# Patient Record
Sex: Male | Born: 1940 | Hispanic: Yes | Marital: Married | Smoking: Never smoker
Health system: Southern US, Community
[De-identification: ages and names within clinical notes are randomized; demographics above are authoritative.]

## PROBLEM LIST (undated history)

## (undated) DIAGNOSIS — I10 Essential (primary) hypertension: Secondary | ICD-10-CM

## (undated) HISTORY — DX: Essential (primary) hypertension: I10

---

## 2016-04-07 ENCOUNTER — Encounter (HOSPITAL_BASED_OUTPATIENT_CLINIC_OR_DEPARTMENT_OTHER): Payer: Self-pay

## 2016-04-07 ENCOUNTER — Emergency Department (HOSPITAL_BASED_OUTPATIENT_CLINIC_OR_DEPARTMENT_OTHER)
Admission: EM | Admit: 2016-04-07 | Discharge: 2016-04-07 | Disposition: A | Discharge status: Home / Self Care | Payer: Medicare Other | Attending: Emergency Medicine | Admitting: Emergency Medicine

## 2016-04-07 DIAGNOSIS — R040 Epistaxis: Secondary | ICD-10-CM | POA: Diagnosis present

## 2016-04-07 DIAGNOSIS — I10 Essential (primary) hypertension: Secondary | ICD-10-CM | POA: Insufficient documentation

## 2016-04-07 LAB — BASIC METABOLIC PANEL
ANION GAP: 7 (ref 5–15)
BUN: 19 mg/dL (ref 6–20)
CALCIUM: 9.3 mg/dL (ref 8.9–10.3)
CO2: 27 mmol/L (ref 22–32)
Chloride: 105 mmol/L (ref 101–111)
Creatinine, Ser: 0.89 mg/dL (ref 0.61–1.24)
GLUCOSE: 112 mg/dL — AB (ref 65–99)
POTASSIUM: 3.8 mmol/L (ref 3.5–5.1)
Sodium: 139 mmol/L (ref 135–145)

## 2016-04-07 MED ORDER — ENALAPRIL-HYDROCHLOROTHIAZIDE 5-12.5 MG PO TABS: 1.0000 | ORAL_TABLET | Freq: Every day | ORAL | 0 refills | Status: DC

## 2016-04-07 NOTE — Discharge Instructions (Signed)
[facility] can [facility]. Keep your scheduled plan with your primary care physician 04/14/2016

## 2016-04-07 NOTE — ED Notes (Signed)
ED Provider at bedside. 

## 2016-04-07 NOTE — ED Provider Notes (Signed)
[facility] Provider Note   CSN: [id number] Arrival date & time: 04/07/16  1941  By signing my name below, I, Rosario AdieWilliam Andrew Hiatt, attest that this documentation has been prepared under the direction and in the presence of Doug SouSam Preesha Benjamin, MD. Electronically Signed: Rosario AdieWilliam Andrew Hiatt, ED Scribe. 04/07/16. 10:35 PM.  History   Chief Complaint Chief Complaint  Patient presents with  . Hypertension   The history is provided by the patient and the spouse. No language interpreter was used.    HPI Comments: William Rice is an otherwise healthy 76 y.o. male who presents to the Emergency Department complaining of elevated blood pressure. Pt reports that he sustained an episode of epistaxis of the left nare this morning, which lasted for one minute and resolved on its own at that time. No episodes since. Following this episode, he was [facility] where they noted that his BP was 230/110, and he was subsequently referred into the ED for further management. He also took his BP at home today and his wife states that his systolic number was in the 200s. No h/o prior HTN. He is not currently followed by a PCP; however, his wife has scheduled an appointment to establish care with a PCP on 04/17/16. Denies headache denies chest pain denies lightheadedness he is otherwise asymptomatic no treatment prior to coming here Pt is a non-smoker and he does not drink alcohol excessively. He denies chest pain, shortness of breath, dizziness, headache, or any other associated symptoms/complaints.   History reviewed. No pertinent past medical history.  There are no active problems to display for this patient.  History reviewed. No pertinent surgical history.  Home Medications    Prior to Admission medications   Not on File   Family History No family history on file.  Social History Social History  Substance Use Topics  . Smoking status: Never Smoker  . Smokeless tobacco: Never Used  . Alcohol  use Yes     Comment: wine daily   Allergies   Patient has no known allergies.  Review of Systems Review of Systems  Constitutional: Negative.   HENT: Positive for nosebleeds.   Respiratory: Negative.  Negative for shortness of breath.   Cardiovascular: Negative.  Negative for chest pain.  Gastrointestinal: Negative.   Musculoskeletal: Negative.   Skin: Negative.   Neurological: Negative.  Negative for dizziness and headaches.  Psychiatric/Behavioral: Negative.   All other systems reviewed and are negative.  Physical Exam Updated Vital Signs BP 181/98   Pulse 78   Temp 98.3 F (36.8 C) (Oral)   Resp 18   Ht 5\' 7"  (1.702 m)   Wt 180 lb 12.8 oz (82 kg)   SpO2 95%   BMI 28.32 kg/m   Physical Exam  Constitutional: He appears well-developed and well-nourished.  HENT:  Head: Normocephalic and atraumatic.  Eyes: Conjunctivae are normal. Pupils are equal, round, and reactive to light.  Neck: Neck supple. No tracheal deviation present. No thyromegaly present.  Cardiovascular: Normal rate and regular rhythm.   No murmur heard. Pulmonary/Chest: Effort normal and breath sounds normal.  Abdominal: Soft. Bowel sounds are normal. He exhibits no distension. There is no tenderness.  Musculoskeletal: Normal range of motion. He exhibits no edema or tenderness.  Neurological: He is alert. Coordination normal.  Skin: Skin is warm and dry. No rash noted.  Psychiatric: He has a normal mood and affect.  Nursing note and vitals reviewed. Patient has no bleeding from nose and no dried blood  nares ED Treatments / Results  DIAGNOSTIC STUDIES: Oxygen Saturation is 97% on RA, normal by my interpretation.   COORDINATION OF CARE: 10:35 PM-Discussed next steps with pt. Pt verbalized understanding and is agreeable with the plan.   Labs (all labs ordered are listed, but only abnormal results are displayed) Labs Reviewed - No data to display  EKG  EKG Interpretation  Date/Time:  Monday  April 07 2016 19:58:14 EST Ventricular Rate:  80 PR Interval:  214 QRS Duration: 156 QT Interval:  412 QTC Calculation: 475 R Axis:   6 Text Interpretation:  Sinus rhythm with 1st degree A-V block Left bundle branch block Abnormal ECG No old tracing to compare Confirmed by Ethelda Chick  MD, Timera Windt [phone]) on 04/07/2016 8:00:40 PM      Results for orders placed or performed during the hospital encounter of 04/07/16  Basic metabolic panel  Result Value Ref Range   Sodium 139 135 - 145 mmol/L   Potassium 3.8 3.5 - 5.1 mmol/L   Chloride 105 101 - 111 mmol/L   CO2 27 22 - 32 mmol/L   Glucose, Bld 112 (H) 65 - 99 mg/dL   BUN 19 6 - 20 mg/dL   Creatinine, Ser 1.91 0.61 - 1.24 mg/dL   Calcium 9.3 8.9 - 47.8 mg/dL   GFR calc non Af Amer >60 >60 mL/min   GFR calc Af Amer >60 >60 mL/min   Anion gap 7 5 - 15   No results found.Radiology No results found.  Procedures Procedures   Medications Ordered in ED Medications - No data to display  Initial Impression / Assessment and Plan / ED Course  I have reviewed the triage vital signs and the nursing notes.  Pertinent labs & imaging results that were available during my care of the patient were reviewed by me and considered in my medical decision making (see chart for details).  Clinical Course    No signs of hypertensive emergency or end organ damage. He has essentially asymptomatic hypertension with an incidental brief and self-limiting nosebleed today. Plan keep scheduled appointment with primary care physician in 10 days. Prescription enalapril-HCTZ Final Clinical Impressions(s) / ED Diagnoses  Diagnosis #1 hypertension #2 epistaxis Final diagnoses:  None   New Prescriptions New Prescriptions   No medications on file   I personally performed the services described in this documentation, which was scribed in my presence. The recorded information has been reviewed and considered.    Doug Sou, MD 04/07/16 [phone]

## 2016-04-07 NOTE — ED Triage Notes (Signed)
C/o elevated BP 2 weeks ago when he checked it in [facility]-no known hx-was sent from [facility] for BP 230/110-pt denies HA, CP-did have nosebleed earlier today-none at present-NAD-steady gait

## 2016-04-17 ENCOUNTER — Ambulatory Visit: Payer: Self-pay | Admitting: Medical

## 2016-04-24 ENCOUNTER — Encounter: Payer: Self-pay | Admitting: Medical

## 2016-04-24 ENCOUNTER — Ambulatory Visit (INDEPENDENT_AMBULATORY_CARE_PROVIDER_SITE_OTHER): Payer: Medicare Other | Admitting: Medical

## 2016-04-24 VITALS — BP 130/87 | HR 79 | Temp 98.0°F | Ht 67.0 in | Wt 177.8 lb

## 2016-04-24 DIAGNOSIS — I10 Essential (primary) hypertension: Secondary | ICD-10-CM

## 2016-04-24 NOTE — Patient Instructions (Signed)
Your bp when I checked was good. I want you to check twice daily and write readings down so we can confirm you are on correct dose. If bp levels above 140/90 when you check will increase your dose.  Try to walk 20-30 minutes a day. Low cholesterol, low sugar and low salt diet.  Follow up in 2 weeks early am. Come in fasting will recheck bp and review your bp readings. Get lipid panel and a1-c.

## 2016-04-24 NOTE — Progress Notes (Signed)
Subjective:    Patient ID: William Rice, male    DOB: 13-Jan-1941, 76 y.o.   MRN: [id number]  HPI   I have reviewed pt PMH, PSH, FH, Social History and Surgical History.  Pt states he feels good today. He only states about one month had brief nose bleed. Pt son thought maybe his blood pressure was elevated. Pt has checked his bp various times since then. Pt states one time at his home his systolic was 200 at home. At ED on 04-07-2016  bp was 181/98. Prior to that was high at [facility] and told go to ED.  Pt was given bp med enalarpil/hctz. His bp is better. No obvious side effect of med. Pt thinks after med sometimes faint head pressure that lasts on for seconds and then passes with residual effect. He states this rare and random. No symptoms presently.  No cardiac or neurologic signs or symptoms today.  Pt nose bleeds have not been recurrent. Only one time in [place]. And then 2 days before he went to [facility].    Pt never had flu vaccine(pt declined).  Pt does not get regular maintenance health care. Last seen for knee pain 10 years.      Review of Systems  Constitutional: Negative for chills, fatigue and fever.  HENT: Negative for congestion and drooling.   Respiratory: Negative for cough, choking, chest tightness, shortness of breath and wheezing.   Cardiovascular: Negative for chest pain and palpitations.  Gastrointestinal: Negative for abdominal pain.  Genitourinary: Negative for dysuria and flank pain.  Musculoskeletal: Negative for back pain.  Skin: Negative for rash.  Neurological: Negative for dizziness, seizures, speech difficulty, weakness, light-headedness and headaches.  Hematological: Does not bruise/bleed easily.  Psychiatric/Behavioral: Negative for behavioral problems, confusion, hallucinations, sleep disturbance and suicidal ideas. The patient is not nervous/anxious.     Past Medical History:  Diagnosis Date  . Hypertension      Social History   Social  History  . Marital status: Married    Spouse name: N/A  . Number of children: N/A  . Years of education: N/A   Occupational History  . Not on file.   Social History Main Topics  . Smoking status: Never Smoker  . Smokeless tobacco: Never Used  . Alcohol use Yes     Comment: wine daily  . Drug use: No  . Sexual activity: Not on file   Other Topics Concern  . Not on file   Social History Narrative  . No narrative on file    No past surgical history on file.  Family History  Problem Relation Age of Onset  . Heart disease Father     No Known Allergies  Current Outpatient Prescriptions on File Prior to Visit  Medication Sig Dispense Refill  . Enalapril-Hydrochlorothiazide 5-12.5 MG tablet Take 1 tablet by mouth daily. 30 tablet 0   No current facility-administered medications on file prior to visit.     BP (!) 142/87   Pulse 79   Temp 98 F (36.7 C) (Oral)   Ht 5\' 7"  (1.702 m)   Wt 177 lb 12.8 oz (80.6 kg)   SpO2 97%   BMI 27.85 kg/m       Objective:   Physical Exam   General Mental Status- Alert. General Appearance- Not in acute distress.   Skin General: Color- Normal Color. Moisture- Normal Moisture.  Neck Carotid Arteries- Normal color. Moisture- Normal Moisture. No carotid bruits. No JVD.  Chest and Lung  Exam Auscultation: Breath Sounds:-Normal.  Cardiovascular Auscultation:Rythm- Regular. Murmurs & Other Heart Sounds:Auscultation of the heart reveals- No Murmurs.  Abdomen Inspection:-Inspeection Normal. Palpation/Percussion:Note:No mass. Palpation and Percussion of the abdomen reveal- Non Tender, Non Distended + BS, no rebound or guarding.   Neurologic Cranial Nerve exam:- CN III-XII intact(No nystagmus), symmetric smile. Finger to Nose:- Normal/Intact Strength:- 5/5 equal and symmetric strength both upper and lower extremities.     Assessment & Plan:  Your bp when I checked was good. I want you to check twice daily and write  readings down so we can confirm you are on correct dose. If bp levels above 140/90 when you check will increase your dose.  Try to walk 20-30 minutes a day. Low cholesterol, low sugar and low salt diet.  Follow up in 2 weeks early am. Come in fasting will recheck bp and review your bp readings. Get lipid panel and a1-c.  Kyung Muto, Ramon DredgeEdward, PA-C

## 2016-05-08 ENCOUNTER — Ambulatory Visit (INDEPENDENT_AMBULATORY_CARE_PROVIDER_SITE_OTHER): Payer: Medicare Other | Admitting: Medical

## 2016-05-08 ENCOUNTER — Encounter: Payer: Self-pay | Admitting: Medical

## 2016-05-08 ENCOUNTER — Telehealth: Payer: Self-pay | Admitting: Medical

## 2016-05-08 VITALS — BP 160/85 | HR 66 | Temp 97.5°F | Ht 67.0 in | Wt 178.6 lb

## 2016-05-08 DIAGNOSIS — I10 Essential (primary) hypertension: Secondary | ICD-10-CM | POA: Diagnosis not present

## 2016-05-08 DIAGNOSIS — Z23 Encounter for immunization: Secondary | ICD-10-CM | POA: Diagnosis not present

## 2016-05-08 LAB — LIPID PANEL
CHOL/HDL RATIO: 4
Cholesterol: 223 mg/dL — ABNORMAL HIGH (ref 0–200)
HDL: 56.2 mg/dL (ref >39.00)
LDL Cholesterol: 146 mg/dL — ABNORMAL HIGH (ref 0–99)
NonHDL: 166.42
TRIGLYCERIDES: 103 mg/dL (ref 0.0–149.0)
VLDL: 20.6 mg/dL (ref 0.0–40.0)

## 2016-05-08 MED ORDER — ATORVASTATIN CALCIUM 10 MG PO TABS: 10.0000 mg | ORAL_TABLET | Freq: Every day | ORAL | 3 refills | Status: DC

## 2016-05-08 MED ORDER — LISINOPRIL-HYDROCHLOROTHIAZIDE 10-12.5 MG PO TABS: 1.0000 | ORAL_TABLET | Freq: Every day | ORAL | 3 refills | Status: DC

## 2016-05-08 MED ORDER — ENALAPRIL MALEATE 10 MG PO TABS: 10.0000 mg | ORAL_TABLET | Freq: Every day | ORAL | 3 refills | Status: DC

## 2016-05-08 NOTE — Progress Notes (Signed)
   Subjective:    Patient ID: William Rice, male    DOB: Oct 12, 1940, 76 y.o.   MRN: [id number]  HPI  Pt in for follow up.   Pt has htn. His bp readings are 162/87, 141/81, 132/80,138/84, 133/73, 140/85, 127/77, and 134/81. These are some of his readings. 14 out of 26 are in 135/80 range. 9 are in 140/90 range. 3 are in 160/90 range. I had asked him to come in with readings since bp elevated last time. No cardiac or neurologic symptoms.  Pt is fasting.  He does want flu vaccine today.   Review of Systems  Constitutional: Negative for chills, fatigue and fever.  HENT: Negative for congestion and drooling.   Respiratory: Negative for chest tightness, shortness of breath and wheezing.   Cardiovascular: Negative for chest pain and palpitations.  Musculoskeletal: Negative for back pain and myalgias.  Skin: Negative for rash.  Neurological: Negative for dizziness, tremors, seizures, light-headedness and numbness.  Hematological: Negative for adenopathy. Does not bruise/bleed easily.  Psychiatric/Behavioral: Negative for agitation, behavioral problems, decreased concentration, dysphoric mood and sleep disturbance.    Past Medical History:  Diagnosis Date  . Hypertension      Social History   Social History  . Marital status: Married    Spouse name: N/A  . Number of children: N/A  . Years of education: N/A   Occupational History  . Not on file.   Social History Main Topics  . Smoking status: Never Smoker  . Smokeless tobacco: Never Used  . Alcohol use Yes     Comment: wine daily. one glass  . Drug use: No  . Sexual activity: Not on file   Other Topics Concern  . Not on file   Social History Narrative  . No narrative on file    No past surgical history on file.  Family History  Problem Relation Age of Onset  . Heart disease Father     No Known Allergies  Current Outpatient Prescriptions on File Prior to Visit  Medication Sig Dispense Refill  . [DISCONTINUED]  Enalapril-Hydrochlorothiazide 5-12.5 MG tablet Take 1 tablet by mouth daily. 30 tablet 0   No current facility-administered medications on file prior to visit.     BP (!) 160/85   Pulse 66   Temp 97.5 F (36.4 C) (Oral)   Ht 5\' 7"  (1.702 m)   Wt 178 lb 9.6 oz (81 kg)   SpO2 98%   BMI 27.97 kg/m        Objective:   Physical Exam  General Mental Status- Alert. General Appearance- Not in acute distress.   Skin General: Color- Normal Color. Moisture- Normal Moisture.  Neck Carotid Arteries- Normal color. Moisture- Normal Moisture. No carotid bruits. No JVD.  Chest and Lung Exam Auscultation: Breath Sounds:-Normal.  Cardiovascular Auscultation:Rythm- Regular. Murmurs & Other Heart Sounds:Auscultation of the heart reveals- No Murmurs.  Abdomen Inspection:-Inspeection Normal. Palpation/Percussion:Note:No mass. Palpation and Percussion of the abdomen reveal- Non Tender, Non Distended + BS, no rebound or guarding.   Neurologic Cranial Nerve exam:- CN III-XII intact(No nystagmus), symmetric smile. Strength:- 5/5 equal and symmetric strength both upper and lower extremities.      Assessment & Plan:  For your htn decided to write you lisinopril 10/12.5 1 tab po q day. Continue to check. Want to see 130/80-0-140/90 majority of time.(stop enalapril/hctz) Increasing dose incrimentally.  Get liipid panel today.  Flu vaccine given.  Follow up in 3 months or as needed

## 2016-05-08 NOTE — Patient Instructions (Addendum)
For your htn decided to write you lisinopril 10/12.5 1 tab po q day. Continue to check. Want to see 130/80-0-140/90 majority of time.(stop enalapril/hctz) Increasing dose incrimentally.  Get liipid panel today.  Flu vaccine given.  Follow up in 3 months or as needed  Explained to pt come in sooner if bp is not well controlled.

## 2016-05-08 NOTE — Progress Notes (Signed)
Pre visit review using our clinic tool,if applicable. No additional management support is needed unless otherwise documented below in the visit note.  

## 2016-05-08 NOTE — Telephone Encounter (Signed)
rx atorvastatin sent to pharmacy. 

## 2016-05-09 NOTE — Telephone Encounter (Signed)
Called pt and LVM for pt to call back.

## 2016-05-13 NOTE — Progress Notes (Signed)
Pt was informed about his results and will pick up prescription at pharmacy and already schedule his lab appt on Aug 11, 2016.

## 2016-07-25 ENCOUNTER — Ambulatory Visit (INDEPENDENT_AMBULATORY_CARE_PROVIDER_SITE_OTHER): Payer: Medicare Other | Admitting: Medical

## 2016-07-25 ENCOUNTER — Encounter: Payer: Self-pay | Admitting: Medical

## 2016-07-25 VITALS — BP 139/79 | HR 60 | Temp 97.9°F | Resp 16 | Ht 67.0 in | Wt 182.6 lb

## 2016-07-25 DIAGNOSIS — E785 Hyperlipidemia, unspecified: Secondary | ICD-10-CM

## 2016-07-25 DIAGNOSIS — I10 Essential (primary) hypertension: Secondary | ICD-10-CM | POA: Diagnosis not present

## 2016-07-25 DIAGNOSIS — H6123 Impacted cerumen, bilateral: Secondary | ICD-10-CM | POA: Diagnosis not present

## 2016-07-25 LAB — COMPREHENSIVE METABOLIC PANEL
ALBUMIN: 4.5 g/dL (ref 3.5–5.2)
ALT: 17 U/L (ref 0–53)
AST: 16 U/L (ref 0–37)
Alkaline Phosphatase: 55 U/L (ref 39–117)
BILIRUBIN TOTAL: 1 mg/dL (ref 0.2–1.2)
BUN: 13 mg/dL (ref 6–23)
CALCIUM: 9.6 mg/dL (ref 8.4–10.5)
CO2: 31 mEq/L (ref 19–32)
Chloride: 104 mEq/L (ref 96–112)
Creatinine, Ser: 0.79 mg/dL (ref 0.40–1.50)
GFR: 102.09 mL/min (ref >60.00)
GLUCOSE: 88 mg/dL (ref 70–99)
POTASSIUM: 3.7 meq/L (ref 3.5–5.1)
Sodium: 140 mEq/L (ref 135–145)
Total Protein: 7.5 g/dL (ref 6.0–8.3)

## 2016-07-25 LAB — LIPID PANEL
CHOLESTEROL: 139 mg/dL (ref 0–200)
HDL: 55.6 mg/dL (ref >39.00)
LDL Cholesterol: 67 mg/dL (ref 0–99)
NONHDL: 83.5
TRIGLYCERIDES: 83 mg/dL (ref 0.0–149.0)
Total CHOL/HDL Ratio: 3
VLDL: 16.6 mg/dL (ref 0.0–40.0)

## 2016-07-25 NOTE — Progress Notes (Signed)
Subjective:    Patient ID: William Rice, male    DOB: 01/04/1941, 76 y.o.   MRN: [id number]  HPI  Pt in states 4 days ago he got pressure sensation very mild. No nasal congestion, no runny nose and not ear pain. Left ear can't hear anything.  Pt bp readings at home 120-130 range systolic and diastolic 75-80. High here initially.  Pt is fasting today. He wants to go ahead and check lipids today.     Review of Systems  Constitutional: Negative for chills, fatigue and fever.  HENT: Negative for congestion, ear pain, mouth sores, postnasal drip, sinus pain, sinus pressure and sore throat.        Ear blocked sensation both sides. Lt side worse than rt.  Respiratory: Negative for cough, chest tightness, shortness of breath and wheezing.   Cardiovascular: Negative for palpitations.  Gastrointestinal: Negative for abdominal pain, blood in stool, diarrhea and nausea.  Musculoskeletal: Negative for back pain and gait problem.  Skin: Negative for rash.  Neurological: Negative for dizziness, speech difficulty, weakness, numbness and headaches.  Hematological: Negative for adenopathy. Does not bruise/bleed easily.  Psychiatric/Behavioral: Negative for behavioral problems and confusion.     Past Medical History:  Diagnosis Date  . Hypertension      Social History   Social History  . Marital status: Married    Spouse name: N/A  . Number of children: N/A  . Years of education: N/A   Occupational History  . Not on file.   Social History Main Topics  . Smoking status: Never Smoker  . Smokeless tobacco: Never Used  . Alcohol use Yes     Comment: wine daily. one glass  . Drug use: No  . Sexual activity: Not on file   Other Topics Concern  . Not on file   Social History Narrative  . No narrative on file    No past surgical history on file.  Family History  Problem Relation Age of Onset  . Heart disease Father     No Known Allergies  Current Outpatient  Prescriptions on File Prior to Visit  Medication Sig Dispense Refill  . atorvastatin (LIPITOR) 10 MG tablet Take 1 tablet (10 mg total) by mouth daily. 30 tablet 3  . lisinopril-hydrochlorothiazide (PRINZIDE,ZESTORETIC) 10-12.5 MG tablet Take 1 tablet by mouth daily. 30 tablet 3  . [DISCONTINUED] Enalapril-Hydrochlorothiazide 5-12.5 MG tablet Take 1 tablet by mouth daily. 30 tablet 0   No current facility-administered medications on file prior to visit.     BP (!) 156/71 (BP Location: Right Arm, Patient Position: Sitting, Cuff Size: Large)   Pulse 60   Temp 97.9 F (36.6 C) (Oral)   Resp 16   Ht  (1.702 m)   Wt 182 lb 9.6 oz (82.8 kg)   SpO2 98%   BMI 28.60 kg/m       Objective:   Physical Exam  General  Mental Status - Alert. General Appearance - Well groomed. Not in acute distress.  Skin Rashes- No Rashes.  HEENT Head- Normal. Ear Auditory Canal - Left- severe wax obstruction. Right - mild wax obstruction =Tympanic Membrane- Left- Normal. Right- Normal. Eye Sclera/Conjunctiva- Left- Normal. Right- Normal. Nose & Sinuses Nasal Mucosa- Left-  Boggy and Congested. Right-  Boggy and  Congested.Bilateral no  maxillary and  nofrontal sinus pressure. Mouth & Throat Lips: Upper Lip- Normal: no dryness, cracking, pallor, cyanosis, or vesicular eruption. Lower Lip-Normal: no dryness, cracking, pallor, cyanosis or vesicular eruption. Buccal Mucosa-  Bilateral- No Aphthous ulcers. Oropharynx- No Discharge or Erythema. Tonsils: Characteristics- Bilateral- No Erythema or Congestion. Size/Enlargement- Bilateral- No enlargement. Discharge- bilateral-None.  Neck Neck- Supple. No Masses.   Chest and Lung Exam Auscultation: Breath Sounds:-Clear even and unlabored.  Cardiovascular Auscultation:Rythm- Regular, rate and rhythm. Murmurs & Other Heart Sounds:Ausculatation of the heart reveal- No Murmurs.  Lymphatic Head & Neck General Head & Neck Lymphatics: Bilateral:  Description- No Localized lymphadenopathy.   Neurologic Cranial Nerve exam:- CN III-XII intact(No nystagmus), symmetric smile. Strength:- 5/5 equal and symmetric strength both upper and lower extremities.      Assessment & Plan:  Your left ear had minimal wax removed today. Wax was  very hard and a lot. So we want you to use debrox over the counter twice daily to soften wax. Then follow up on Tuesday for re-lavage.  Your bp is fair today on recheck by myself. But I want you to continue to check daily over next week to confirm not increasing.  For your high cholesterol please go ahead and get cmp and lipid panel today.  Follow up date to be determined after lab review.  Aleaha Fickling, Ramon Dredge, PA-C

## 2016-07-25 NOTE — Progress Notes (Signed)
Pre visit review using our clinic review tool, if applicable. No additional management support is needed unless otherwise documented below in the visit note. 

## 2016-07-25 NOTE — Patient Instructions (Addendum)
Your left ear had minimal wax removed today. Wax was very hard and a lot. So we want you to use debrox over the counter twice daily to soften wax. Then follow up on Tuesday for re-lavage.  Your bp is fair today on recheck by myself. But I want you to continue to check daily over next week to confirm not increasing.  For your high cholesterol please go ahead and get cmp and lipid panel today.  Follow up date to be determined after lab review.  Continue current med regimen.  Will update you when labs are in.

## 2016-07-29 ENCOUNTER — Ambulatory Visit (INDEPENDENT_AMBULATORY_CARE_PROVIDER_SITE_OTHER): Payer: Medicare Other | Admitting: Medical

## 2016-07-29 ENCOUNTER — Encounter: Payer: Self-pay | Admitting: Medical

## 2016-07-29 VITALS — BP 138/78 | HR 66 | Temp 97.9°F | Resp 16 | Ht 67.0 in | Wt 178.6 lb

## 2016-07-29 DIAGNOSIS — I10 Essential (primary) hypertension: Secondary | ICD-10-CM

## 2016-07-29 DIAGNOSIS — H6123 Impacted cerumen, bilateral: Secondary | ICD-10-CM | POA: Diagnosis not present

## 2016-07-29 DIAGNOSIS — E785 Hyperlipidemia, unspecified: Secondary | ICD-10-CM | POA: Diagnosis not present

## 2016-07-29 MED ORDER — ATORVASTATIN CALCIUM 10 MG PO TABS: 10.0000 mg | ORAL_TABLET | Freq: Every day | ORAL | 1 refills | Status: DC

## 2016-07-29 NOTE — Patient Instructions (Addendum)
BP good at home and when I checked today good as well. Continue current med.  Cholesterol is well controlled. Refill medication/lipitor at same dose.   Wax removed successfully completely. If comes back use debrox 3-4 days in a row and come in for re-lavage.  Follow up 6 months or as needed

## 2016-07-29 NOTE — Progress Notes (Signed)
Subjective:    Patient ID: Taryn Nave, male    DOB: 11-29-1940, 76 y.o.   MRN: [id number]  HPI  Still some left ear pressure. Used debrox to soften wax and now in for re-wash.  No cardiac or neurologic signs or symptoms.  BP controlled today. Lipids panel came back and very good controlled since starting lipitor.   Review of Systems  Constitutional: Negative for chills, fatigue and fever.  HENT:       Wax obstruction.  Respiratory: Negative for cough, chest tightness, shortness of breath and wheezing.   Cardiovascular: Negative for chest pain and palpitations.  Musculoskeletal: Negative for back pain and gait problem.  Skin: Negative for rash.  Neurological: Negative for dizziness, light-headedness, numbness and headaches.  Hematological: Negative for adenopathy. Does not bruise/bleed easily.  Psychiatric/Behavioral: Negative for behavioral problems and confusion.    Past Medical History:  Diagnosis Date  . Hypertension      Social History   Social History  . Marital status: Married    Spouse name: N/A  . Number of children: N/A  . Years of education: N/A   Occupational History  . Not on file.   Social History Main Topics  . Smoking status: Never Smoker  . Smokeless tobacco: Never Used  . Alcohol use Yes     Comment: wine daily. one glass  . Drug use: No  . Sexual activity: Not on file   Other Topics Concern  . Not on file   Social History Narrative  . No narrative on file    No past surgical history on file.  Family History  Problem Relation Age of Onset  . Heart disease Father     No Known Allergies  Current Outpatient Prescriptions on File Prior to Visit  Medication Sig Dispense Refill  . lisinopril-hydrochlorothiazide (PRINZIDE,ZESTORETIC) 10-12.5 MG tablet Take 1 tablet by mouth daily. 30 tablet 3  . [DISCONTINUED] Enalapril-Hydrochlorothiazide 5-12.5 MG tablet Take 1 tablet by mouth daily. 30 tablet 0   No current facility-administered  medications on file prior to visit.     BP 138/78   Pulse 66   Temp 97.9 F (36.6 C) (Oral)   Resp 16   Ht  (1.702 m)   Wt 178 lb 9.6 oz (81 kg)   SpO2 97%   BMI 27.97 kg/m       Objective:   Physical Exam  General  Mental Status - Alert. General Appearance - Well groomed. Not in acute distress.  Skin Rashes- No Rashes.  HEENT Head- Normal. Ear Auditory Canal - Left- pre-lavage large amount of wax. Right - Normal.Tympanic Membrane- Left- Normal. Right- Normal.(wax cleared by lavage left side) Eye Sclera/Conjunctiva- Left- Normal. Right- Normal. Nose & Sinuses Nasal Mucosa- Left-  Not  Boggy and Congested. Right-  Not  Boggy and  Congested.Bilateral maxillary and frontal sinus pressure. Mouth & Throat Lips: Upper Lip- Normal: no dryness, cracking, pallor, cyanosis, or vesicular eruption. Lower Lip-Normal: no dryness, cracking, pallor, cyanosis or vesicular eruption. Buccal Mucosa- Bilateral- No Aphthous ulcers. Oropharynx- No Discharge or Erythema. Tonsils: Characteristics- Bilateral- No Erythema or Congestion. Size/Enlargement- Bilateral- No enlargement. Discharge- bilateral-None.  Neck Neck- Supple. No Masses.   Chest and Lung Exam Auscultation: Breath Sounds:-Clear even and unlabored.  Cardiovascular Auscultation:Rythm- Regular, rate and rhythm. Murmurs & Other Heart Sounds:Ausculatation of the heart reveal- No Murmurs.  Lymphatic Head & Neck General Head & Neck Lymphatics: Bilateral: Description- No Localized lymphadenopathy.       Assessment & Plan:  BP good at home and when I checked today good as well. Continue current med.  Cholesterol is well controlled. Refill medication/lipitor at same dose.   Wax removed successfully completely. If comes back use debrox 3-4 days in a row and come in for re-lavage.  Follow up 6 months or as needed  Osiris Charles, Ramon Dredge, [employer]

## 2016-08-11 ENCOUNTER — Other Ambulatory Visit: Payer: Medicare Other

## 2016-08-29 ENCOUNTER — Telehealth: Payer: Self-pay | Admitting: Medical

## 2016-08-29 MED ORDER — LISINOPRIL-HYDROCHLOROTHIAZIDE 10-12.5 MG PO TABS: 1.0000 | ORAL_TABLET | Freq: Every day | ORAL | 3 refills | Status: DC

## 2016-08-29 NOTE — Telephone Encounter (Signed)
Notified pt rx sent to pharmacy.

## 2016-08-29 NOTE — Telephone Encounter (Signed)
Caller name: Thyra Breednrique Relation to pt: self Call back number: [phone] [facility] [place], [place] - [id number]  Reason for call: Pt called requesting refill on lisinopril-hydrochlorothiazide (PRINZIDE,ZESTORETIC) 10-12.5 MG tablet. Please advise.

## 2016-09-02 ENCOUNTER — Telehealth: Payer: Self-pay | Admitting: Medical

## 2016-09-02 ENCOUNTER — Encounter: Payer: Self-pay | Admitting: Medical

## 2016-09-02 MED ORDER — LISINOPRIL-HYDROCHLOROTHIAZIDE 10-12.5 MG PO TABS: 1.0000 | ORAL_TABLET | Freq: Every day | ORAL | 0 refills | Status: DC

## 2016-09-02 NOTE — Telephone Encounter (Signed)
90 day lisinopril/hctz sent to pt pharmacy.

## 2016-09-02 NOTE — Telephone Encounter (Signed)
Pt called stating went and got his rx but was needing for 90 days supply since pt is going to [place] is will needs his rx for at least 90 day supply (so he would need an extra 60 day supply of meds) Please advise.

## 2016-09-05 ENCOUNTER — Telehealth: Payer: Self-pay | Admitting: Medical

## 2016-09-08 NOTE — Telephone Encounter (Signed)
error:[id number] ° °

## 2017-02-05 ENCOUNTER — Other Ambulatory Visit: Payer: Self-pay | Admitting: Medical

## 2017-02-05 NOTE — Telephone Encounter (Signed)
Pt due for follow up please call and schedule appointment.  

## 2017-02-09 NOTE — Telephone Encounter (Signed)
Patient currently unable to schedule appointment at this time and will call back.

## 2017-02-24 ENCOUNTER — Encounter: Payer: Self-pay | Admitting: Medical

## 2017-02-24 ENCOUNTER — Ambulatory Visit (INDEPENDENT_AMBULATORY_CARE_PROVIDER_SITE_OTHER): Payer: Medicare Other | Admitting: Medical

## 2017-02-24 VITALS — BP 140/89 | HR 70 | Temp 97.9°F | Resp 16 | Ht 67.0 in | Wt 182.0 lb

## 2017-02-24 DIAGNOSIS — E785 Hyperlipidemia, unspecified: Secondary | ICD-10-CM | POA: Diagnosis not present

## 2017-02-24 DIAGNOSIS — I10 Essential (primary) hypertension: Secondary | ICD-10-CM

## 2017-02-24 DIAGNOSIS — Z23 Encounter for immunization: Secondary | ICD-10-CM | POA: Diagnosis not present

## 2017-02-24 MED ORDER — LISINOPRIL 10 MG PO TABS: 10.0000 mg | ORAL_TABLET | Freq: Every day | ORAL | 0 refills | Status: AC

## 2017-02-24 MED ORDER — ATORVASTATIN CALCIUM 10 MG PO TABS: 10.0000 mg | ORAL_TABLET | Freq: Every day | ORAL | 1 refills | Status: AC

## 2017-02-24 NOTE — Patient Instructions (Signed)
Your blood pressure is borderline elevated today.  It has been high in the past in our office in the past but better at your home.  Continue the lisinopril 10/12.5 mg.  I want you to resume blood pressure checks at home.  If your blood pressures are over 140/90 then I want you to add lisinopril 10 mg to your regimen.  Prescription given today as print Rx.  Feel if needed.  If you end up using the extra 10 mg of lisinopril then in the future I could write you lisinopril 20/12.5 tabs on the refill.  For high cholesterol refilling your atorvastatin today.  You were not fasting but will repeat fasting labs on follow-up.  PSB 13 given today.  Follow-up in 3 months or as needed.  Remember to schedule early morning appointment and come in fasting on follow-up.

## 2017-02-24 NOTE — Progress Notes (Signed)
Subjective:    Patient ID: William Rice, male    DOB: April 16, 1940, 76 y.o.   MRN: [id number]  HPI  Pt in for follow up.   He in bp med prinzide. Pt has not been checking his blood pressure at home. He stopped since his bp was well controlled. States his bp at home was steady 130/80 for long time since last visit.  Pt cholesterol in April was well controlled. Pt is on atorvastatin.    Pt feels good. No acute complaints.  Pt not fasting today.  Pt willing to psv 13 today.          Review of Systems  Constitutional: Negative for chills, fatigue and fever.  HENT: Negative for congestion, postnasal drip and sinus pain.   Respiratory: Negative for choking, chest tightness, shortness of breath and wheezing.   Cardiovascular: Negative for chest pain and palpitations.  Gastrointestinal: Negative for abdominal pain, constipation, diarrhea, nausea and vomiting.  Musculoskeletal: Negative for back pain, myalgias, neck pain and neck stiffness.  Skin: Negative for rash.  Neurological: Negative for dizziness, seizures, syncope, weakness and light-headedness.  Psychiatric/Behavioral: Negative for behavioral problems and confusion.   Past Medical History:  Diagnosis Date  . Hypertension      Social History   Socioeconomic History  . Marital status: Married    Spouse name: Not on file  . Number of children: Not on file  . Years of education: Not on file  . Highest education level: Not on file  Social Needs  . Financial resource strain: Not on file  . Food insecurity - worry: Not on file  . Food insecurity - inability: Not on file  . Transportation needs - medical: Not on file  . Transportation needs - non-medical: Not on file  Occupational History  . Not on file  Tobacco Use  . Smoking status: Never Smoker  . Smokeless tobacco: Never Used  Substance and Sexual Activity  . Alcohol use: Yes    Comment: wine daily. one glass  . Drug use: No  . Sexual activity: Not on  file  Other Topics Concern  . Not on file  Social History Narrative  . Not on file    History reviewed. No pertinent surgical history.  Family History  Problem Relation Age of Onset  . Heart disease Father     No Known Allergies  Current Outpatient Medications on File Prior to Visit  Medication Sig Dispense Refill  . atorvastatin (LIPITOR) 10 MG tablet Take 1 tablet (10 mg total) by mouth daily. 90 tablet 1  . lisinopril-hydrochlorothiazide (PRINZIDE,ZESTORETIC) 10-12.5 MG tablet TAKE ONE TABLET BY MOUTH DAILY 90 tablet 0  . [DISCONTINUED] Enalapril-Hydrochlorothiazide 5-12.5 MG tablet Take 1 tablet by mouth daily. 30 tablet 0   No current facility-administered medications on file prior to visit.     BP 140/89   Pulse 70   Temp 97.9 F (36.6 C) (Oral)   Resp 16   Ht 5\' 7"  (1.702 m)   Wt 182 lb (82.6 kg)   BMI 28.51 kg/m       Objective:   Physical Exam  General Mental Status- Alert. General Appearance- Not in acute distress.   Skin General: Color- Normal Color. Moisture- Normal Moisture.  Neck Carotid Arteries- Normal color. Moisture- Normal Moisture. No carotid bruits. No JVD.  Chest and Lung Exam Auscultation: Breath Sounds:-Normal.  Cardiovascular Auscultation:Rythm- Regular. Murmurs & Other Heart Sounds:Auscultation of the heart reveals- No Murmurs.      Neurologic Cranial  Nerve exam:- CN III-XII intact(No nystagmus), symmetric smile. Strength:- 5/5 equal and symmetric strength both upper and lower extremities.      Assessment & Plan:  Your blood pressure is borderline elevated today.  It has been high in the past in our office in the past but better at your home.  Continue the lisinopril 10/12.5 mg.  I want you to resume blood pressure checks at home.  If your blood pressures are over 140/90 then I want you to add lisinopril 10 mg to your regimen.  Prescription given today as print Rx.  Feel if needed.  If you end up using the extra 10 mg of  lisinopril then in the future I could write you lisinopril 20/12.5 tabs on the refill.  For high cholesterol refilling your atorvastatin today.  You were not fasting but will repeat fasting labs on follow-up.  PSB 13 given today.  Follow-up in 3 months or as needed.  Remember to schedule early morning appointment and come in fasting on follow-up.

## 2017-05-15 ENCOUNTER — Ambulatory Visit (INDEPENDENT_AMBULATORY_CARE_PROVIDER_SITE_OTHER): Payer: Medicare Other | Admitting: Medical

## 2017-05-15 ENCOUNTER — Ambulatory Visit (HOSPITAL_BASED_OUTPATIENT_CLINIC_OR_DEPARTMENT_OTHER)
Admission: RE | Admit: 2017-05-15 | Discharge: 2017-05-15 | Disposition: A | Discharge status: Home / Self Care | Payer: Medicare Other | Source: Ambulatory Visit | Attending: Medical | Admitting: Medical

## 2017-05-15 ENCOUNTER — Encounter: Payer: Self-pay | Admitting: Medical

## 2017-05-15 VITALS — BP 153/70 | HR 70 | Temp 97.8°F | Resp 16 | Ht 67.0 in | Wt 179.0 lb

## 2017-05-15 DIAGNOSIS — I10 Essential (primary) hypertension: Secondary | ICD-10-CM | POA: Diagnosis not present

## 2017-05-15 DIAGNOSIS — M25561 Pain in right knee: Secondary | ICD-10-CM | POA: Diagnosis not present

## 2017-05-15 DIAGNOSIS — M79604 Pain in right leg: Secondary | ICD-10-CM

## 2017-05-15 DIAGNOSIS — E785 Hyperlipidemia, unspecified: Secondary | ICD-10-CM | POA: Diagnosis not present

## 2017-05-15 DIAGNOSIS — M1611 Unilateral primary osteoarthritis, right hip: Secondary | ICD-10-CM | POA: Diagnosis not present

## 2017-05-15 LAB — COMPREHENSIVE METABOLIC PANEL
ALBUMIN: 4.4 g/dL (ref 3.5–5.2)
ALK PHOS: 51 U/L (ref 39–117)
ALT: 21 U/L (ref 0–53)
AST: 16 U/L (ref 0–37)
BILIRUBIN TOTAL: 0.8 mg/dL (ref 0.2–1.2)
BUN: 20 mg/dL (ref 6–23)
CALCIUM: 8.9 mg/dL (ref 8.4–10.5)
CO2: 29 mEq/L (ref 19–32)
CREATININE: 0.82 mg/dL (ref 0.40–1.50)
Chloride: 105 mEq/L (ref 96–112)
GFR: 97.57 mL/min (ref >60.00)
Glucose, Bld: 104 mg/dL — ABNORMAL HIGH (ref 70–99)
Potassium: 4.2 mEq/L (ref 3.5–5.1)
Sodium: 140 mEq/L (ref 135–145)
TOTAL PROTEIN: 7.2 g/dL (ref 6.0–8.3)

## 2017-05-15 LAB — LIPID PANEL
CHOL/HDL RATIO: 2
CHOLESTEROL: 134 mg/dL (ref 0–200)
HDL: 54 mg/dL (ref >39.00)
LDL Cholesterol: 70 mg/dL (ref 0–99)
NonHDL: 79.91
TRIGLYCERIDES: 52 mg/dL (ref 0.0–149.0)
VLDL: 10.4 mg/dL (ref 0.0–40.0)

## 2017-05-15 NOTE — Patient Instructions (Signed)
For your high blood pressure please take your bp medication. You did not take your bp med this morning and thus bp is high. Please take med daily and check bp as well. Confirm bp less than 140/90.  For cholesterol check cmp and lipid panel today.   For knee and thigh pain will get xray of both areas. For mild pain tylenol. Avoid nsaids since increases bp.  Follow up date to be determined after lab review.

## 2017-05-15 NOTE — Progress Notes (Signed)
Subjective:    Patient ID: William Rice, male    DOB: 1940/12/24, 77 y.o.   MRN: [id number]  HPI  Pt in for follow up on his blood pressure. He is on zestroretic 10/12.5. Pt check bp this morning was 139/85 when he checked at home.. Pt did not take his bp med this morning. He thought best not to take since he is fasting and might get labs.   He also reports some occasional very transient rt mid thigh pain. Comes and goes and last for 3-4 seconds. Occurs like this for a couple of months. Also he will feel occasional hamstring pain. Hamstring pain is less frequent than 5 pain. He states he does not want any meds just wanted to mention.   Pt does have knee pain on rt side all the time. He has knee brace and tends to limp/change his gait a little bit when worse.. Pain for 2 months as well.  Pt has high cholesterol. Last checked cholesterol 9 months ago.   Review of Systems  Constitutional: Negative for chills, fatigue and fever.  Respiratory: Negative for cough, chest tightness and shortness of breath.   Cardiovascular: Negative for chest pain and palpitations.  Gastrointestinal: Negative for abdominal pain, blood in stool, constipation and diarrhea.  Musculoskeletal:       Knee pain and thigh pain.  Skin: Negative for rash.  Neurological: Negative for dizziness, speech difficulty, weakness, numbness and headaches.  Hematological: Negative for adenopathy. Does not bruise/bleed easily.  Psychiatric/Behavioral: Negative for behavioral problems, confusion, dysphoric mood and hallucinations.    Past Medical History:  Diagnosis Date  . Hypertension      Social History   Socioeconomic History  . Marital status: Married    Spouse name: Not on file  . Number of children: Not on file  . Years of education: Not on file  . Highest education level: Not on file  Social Needs  . Financial resource strain: Not on file  . Food insecurity - worry: Not on file  . Food insecurity -  inability: Not on file  . Transportation needs - medical: Not on file  . Transportation needs - non-medical: Not on file  Occupational History  . Not on file  Tobacco Use  . Smoking status: Never Smoker  . Smokeless tobacco: Never Used  Substance and Sexual Activity  . Alcohol use: Yes    Comment: wine daily. one glass  . Drug use: No  . Sexual activity: Not on file  Other Topics Concern  . Not on file  Social History Narrative  . Not on file    No past surgical history on file.  Family History  Problem Relation Age of Onset  . Heart disease Father     No Known Allergies  Current Outpatient Medications on File Prior to Visit  Medication Sig Dispense Refill  . atorvastatin (LIPITOR) 10 MG tablet Take 1 tablet (10 mg total) by mouth daily. 90 tablet 1  . lisinopril (PRINIVIL,ZESTRIL) 10 MG tablet Take 1 tablet (10 mg total) by mouth daily. 30 tablet 0  . lisinopril-hydrochlorothiazide (PRINZIDE,ZESTORETIC) 10-12.5 MG tablet TAKE ONE TABLET BY MOUTH DAILY 90 tablet 0  . [DISCONTINUED] Enalapril-Hydrochlorothiazide 5-12.5 MG tablet Take 1 tablet by mouth daily. 30 tablet 0   No current facility-administered medications on file prior to visit.     BP (!) 153/70 (BP Location: Right Arm, Patient Position: Sitting, Cuff Size: Small)   Pulse 70   Temp 97.8 F (36.6 C) (  Oral)   Resp 16   Ht 5\' 7"  (1.702 m)   Wt 179 lb (81.2 kg)   SpO2 97%   BMI 28.04 kg/m       Objective:   Physical Exam   General Mental Status- Alert. General Appearance- Not in acute distress.   Skin General: Color- Normal Color. Moisture- Normal Moisture.  Neck Carotid Arteries- Normal color. Moisture- Normal Moisture. No carotid bruits. No JVD.  Chest and Lung Exam Auscultation: Breath Sounds:-Normal.  Cardiovascular Auscultation:Rythm- Regular. Murmurs & Other Heart Sounds:Auscultation of the heart reveals- No Murmurs.  Abdomen Inspection:-Inspeection  Normal. Palpation/Percussion:Note:No mass. Palpation and Percussion of the abdomen reveal- Non Tender, Non Distended + BS, no rebound or guarding.    Neurologic Cranial Nerve exam:- CN III-XII intact(No nystagmus), symmetric smile. Strength:- 5/5 equal and symmetric strength both upper and lower extremities.  Rt knee- on range of motion. Mild crepitus. Good rom.  No instability. Rt lower ext- no pain on palpation of femur or hamstring.     Assessment & Plan:  For your high blood pressure please take your bp medication. You did not take your bp med this morning and thus bp is high. Please take med daily and check bp as well. Confirm bp less than 140/90.  For cholesterol check cmp and lipid panel today.   For knee and thigh pain will get xray of both areas. For mild pain tylenol. Avoid nsaids since increases bp.  Follow up date to be determined after lab review.   Esperanza Richters, PA-C

## 2017-05-19 ENCOUNTER — Telehealth: Payer: Self-pay | Admitting: Medical

## 2017-05-19 NOTE — Telephone Encounter (Signed)
Copied from CRM [id number]. Topic: Quick Communication - Office Called Patient >> May 19, 2017 10:29 AM Everardo [place], [place], [place] wrote: Reason for CRM: Patient had a missed call from the office and would like a call back.Stated he would like it if Annice PihJackie was the person to call him back because she knows him.  He can be reached at [phone]  Spoke with pt and informed the below, pt understood and is ok with his lab results. Pt had no questions. Pt requested refill on lisinopril (PRINIVIL,ZESTRIL) 10 MG tablet, [facility] [place], [place] - [id number]. Please advise.

## 2017-05-20 MED ORDER — LISINOPRIL-HYDROCHLOROTHIAZIDE 10-12.5 MG PO TABS: 1.0000 | ORAL_TABLET | Freq: Every day | ORAL | 0 refills | Status: AC

## 2017-05-20 NOTE — Telephone Encounter (Signed)
Rx sent to pharmacy   

## 2019-01-04 IMAGING — DX DG FEMUR 2+V*R*
4 series · 4 of 4 positions shown · non-contrast
Comparison: None

CLINICAL DATA: Right knee pain.

EXAM:
RIGHT FEMUR 2 VIEWS

[femur ap (1 of 2)]
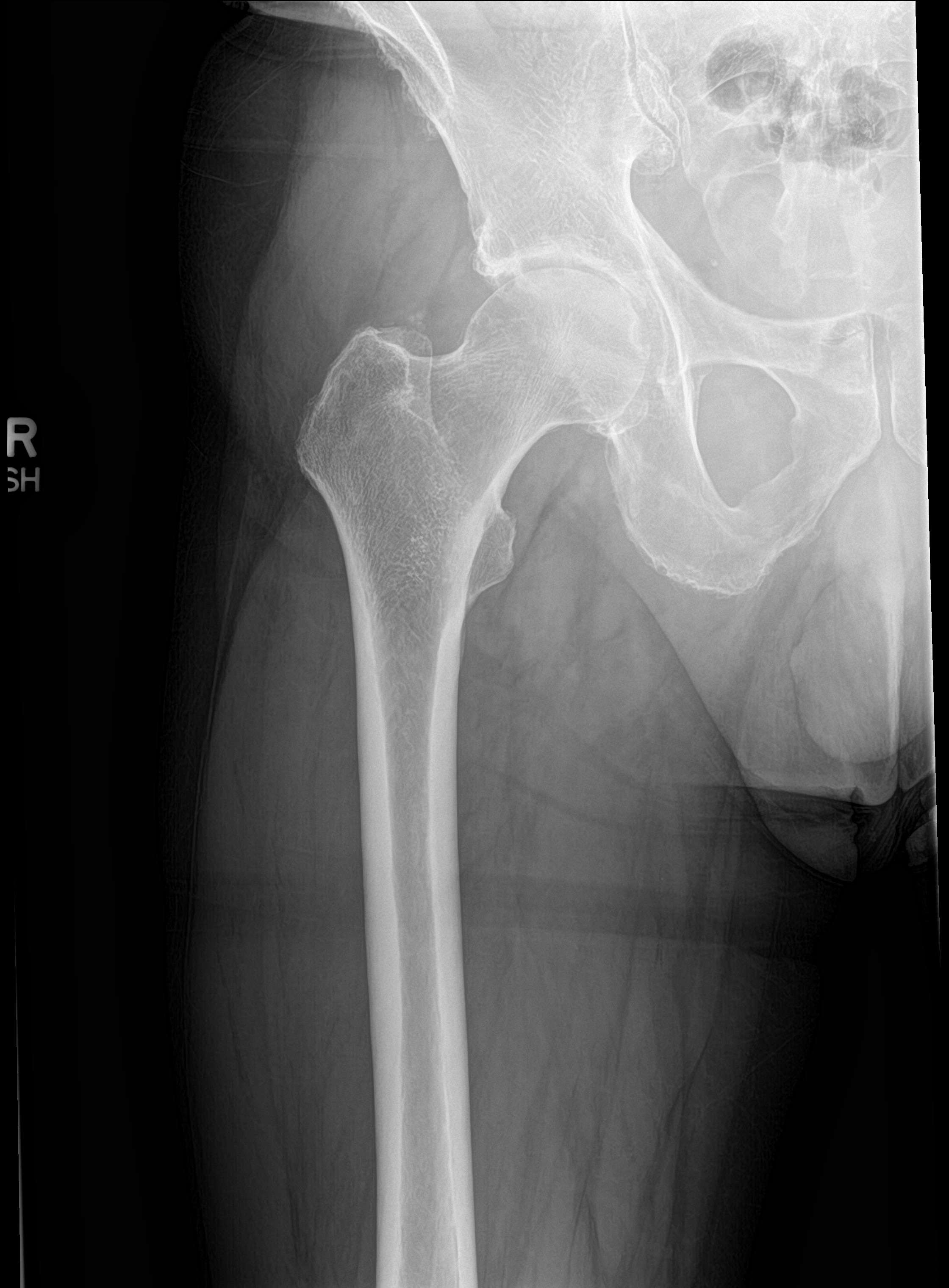

[femur ap (2 of 2)]
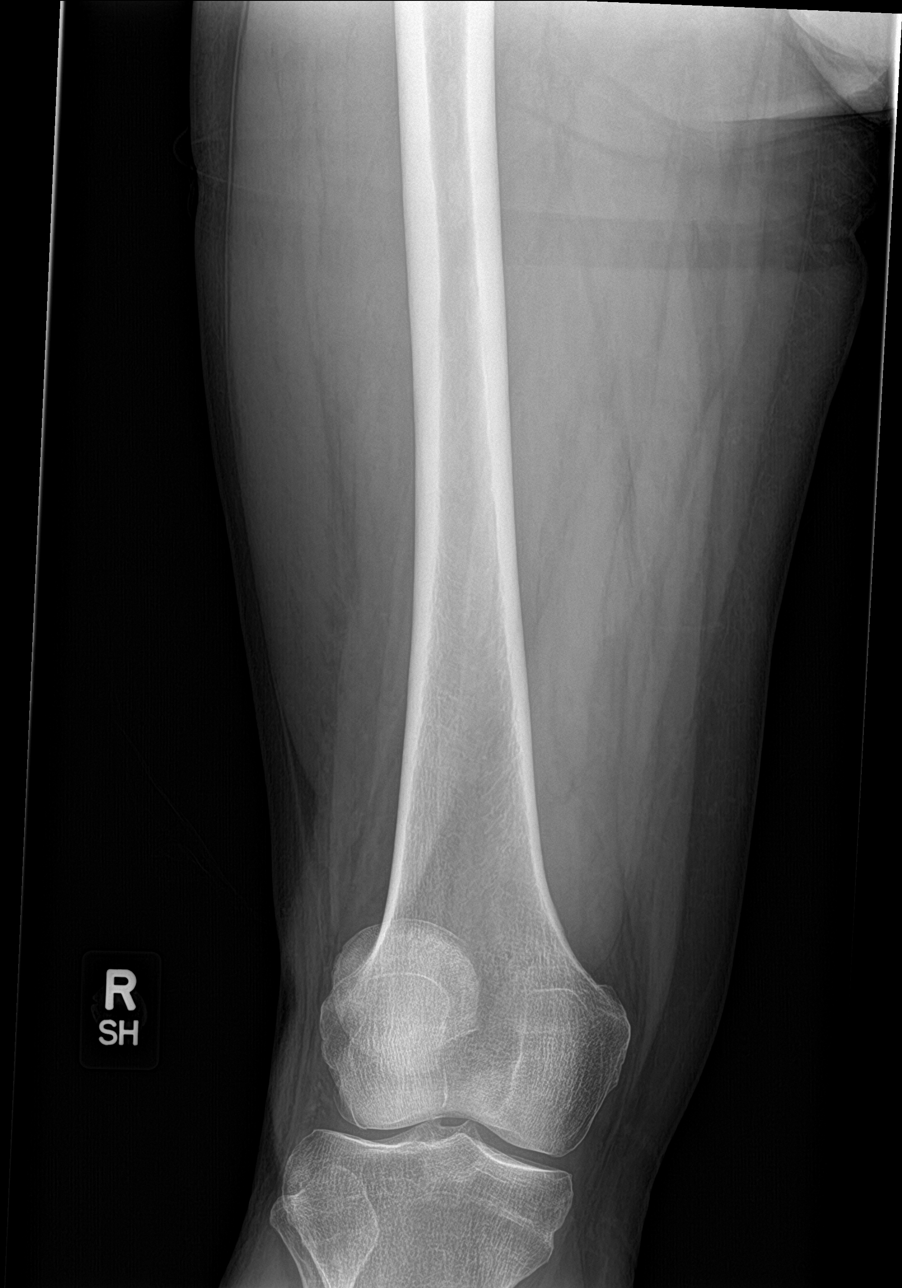

[femur lat (1 of 2)]
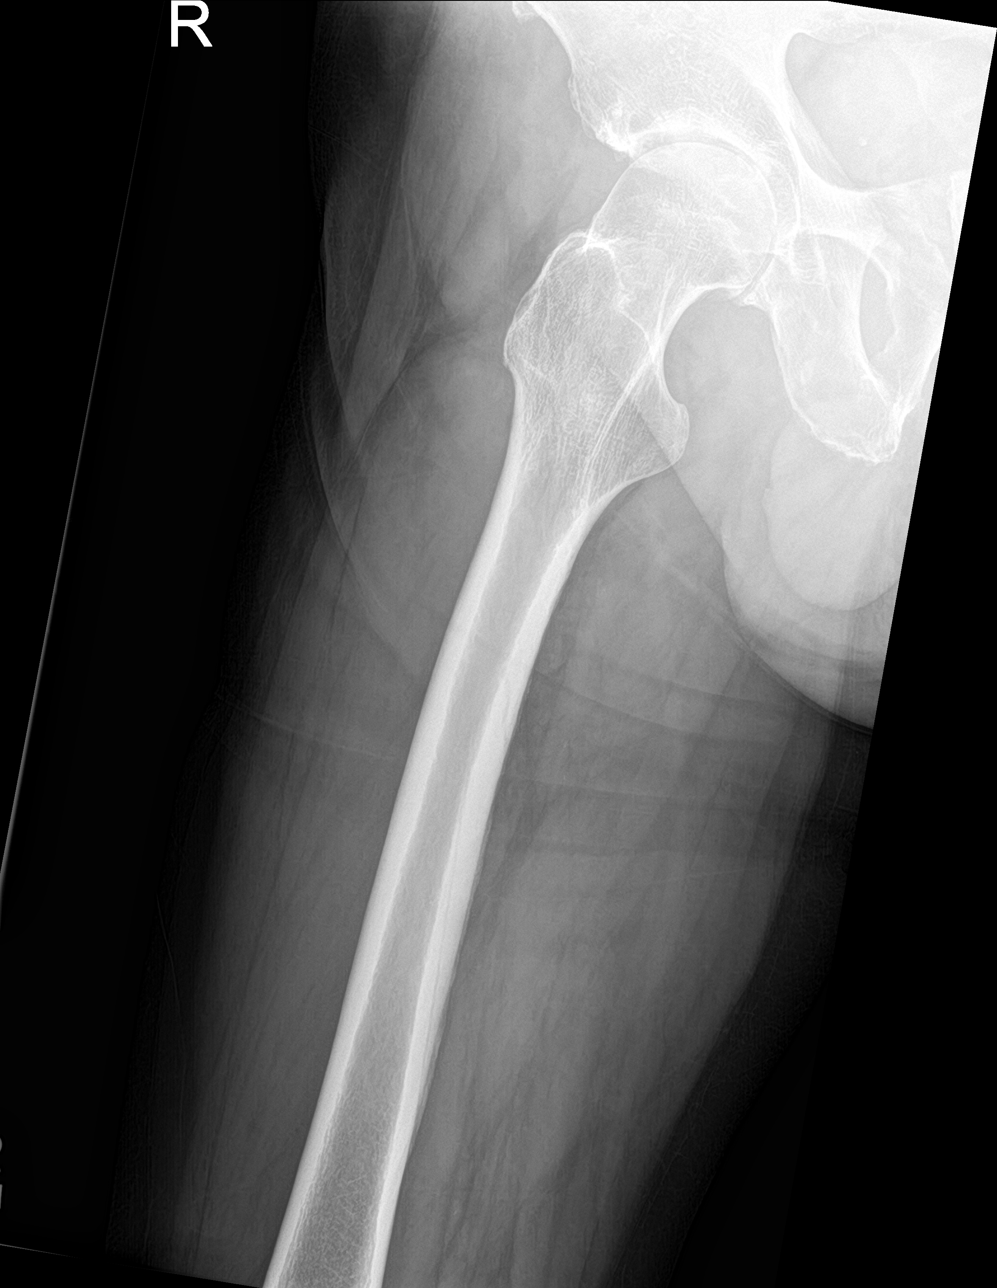

[femur lat (2 of 2)]
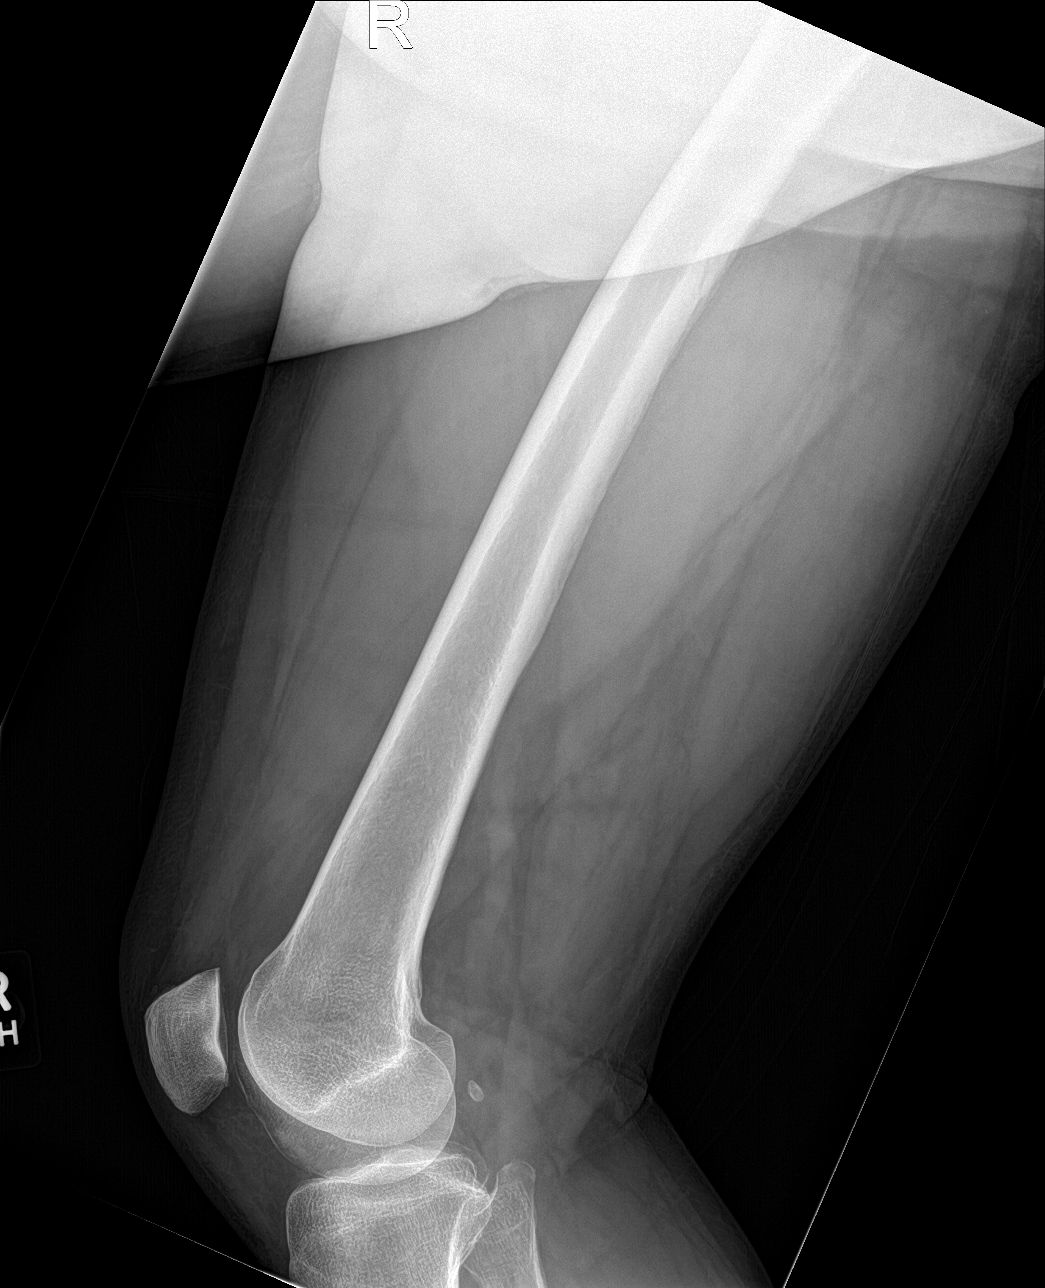

[4 of 4 positions shown; findings below may reference images not displayed]

FINDINGS: There are advanced osteoarthritic changes involving the right hip.
No acute fractures or dislocations identified. Mild degenerative
changes noted within the right knee.
IMPRESSION: 1. No acute findings.
2. Right hip osteoarthritis.

## 2019-01-04 IMAGING — DX DG KNEE 3 VIEWS*R*
3 series · 3 of 3 positions shown · non-contrast
Comparison: None.

CLINICAL DATA: Right knee pain in the knee cap area for 2 months

EXAM:
RIGHT KNEE - 3 VIEW

[knee ap]
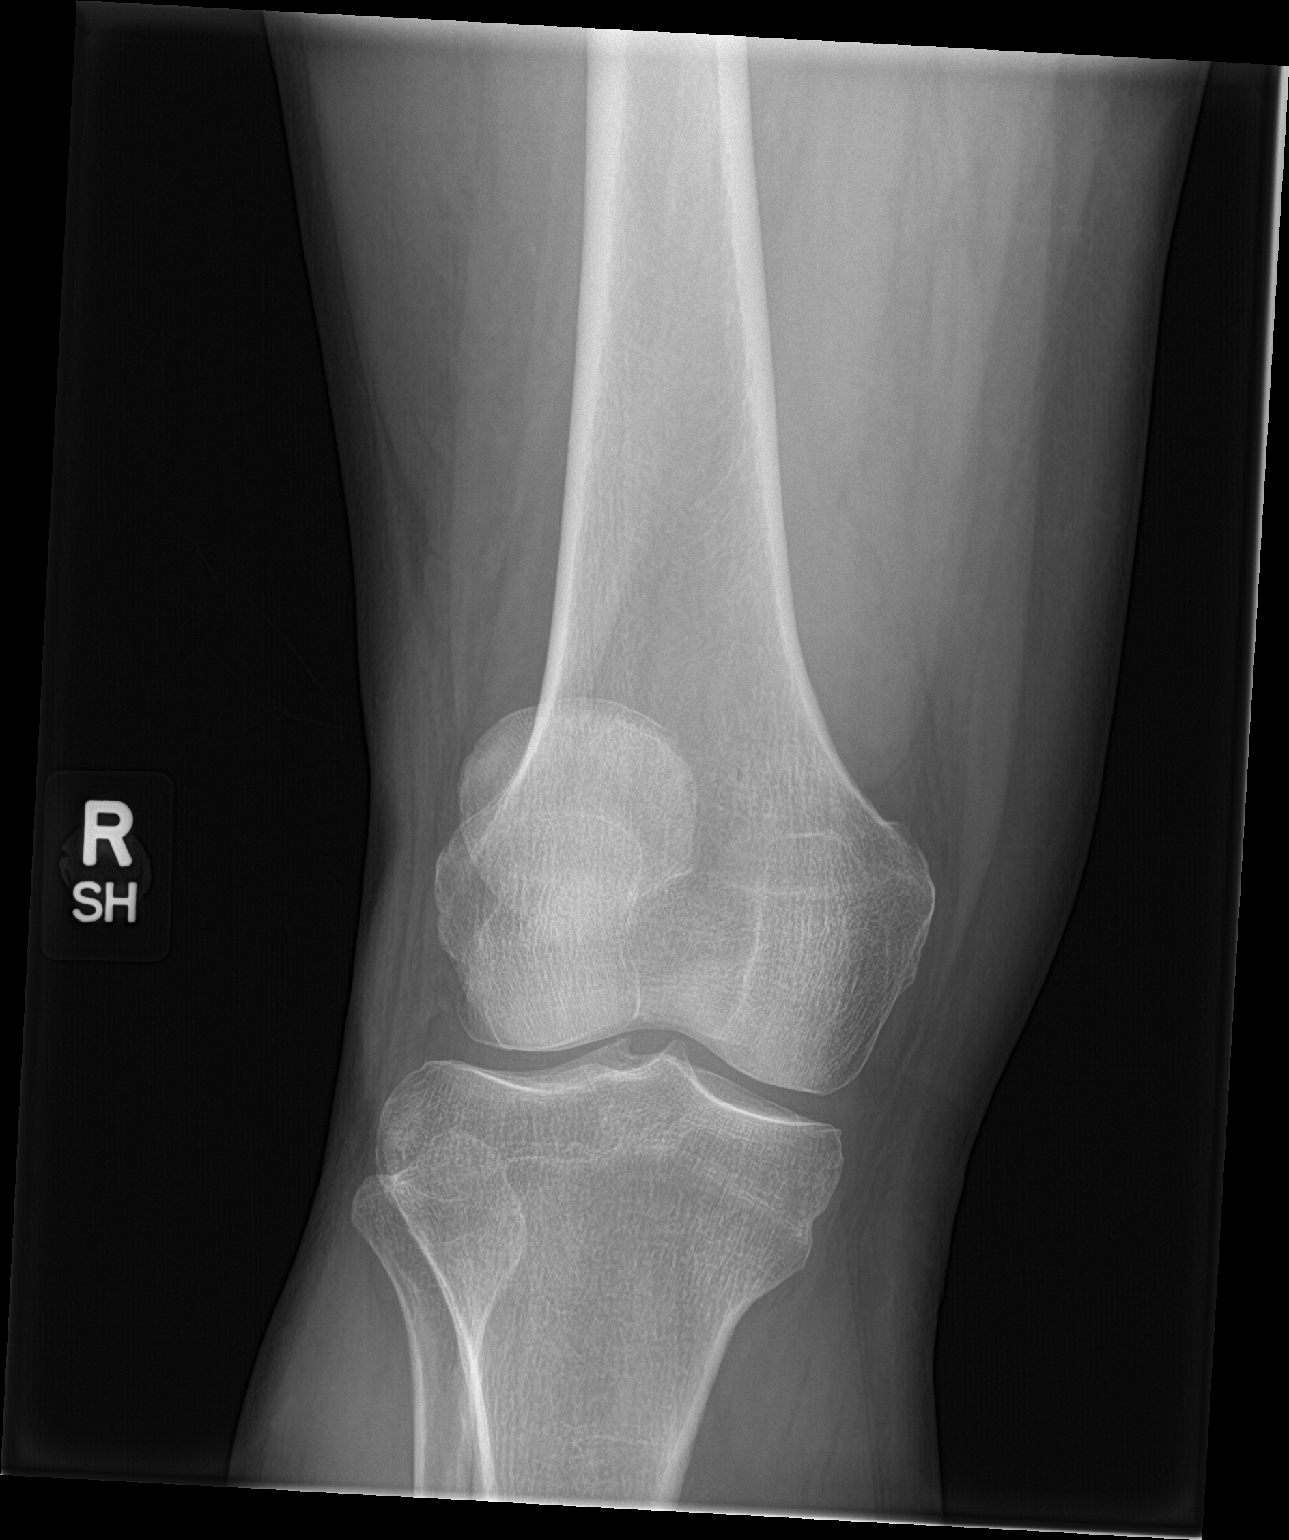

[knee lat]
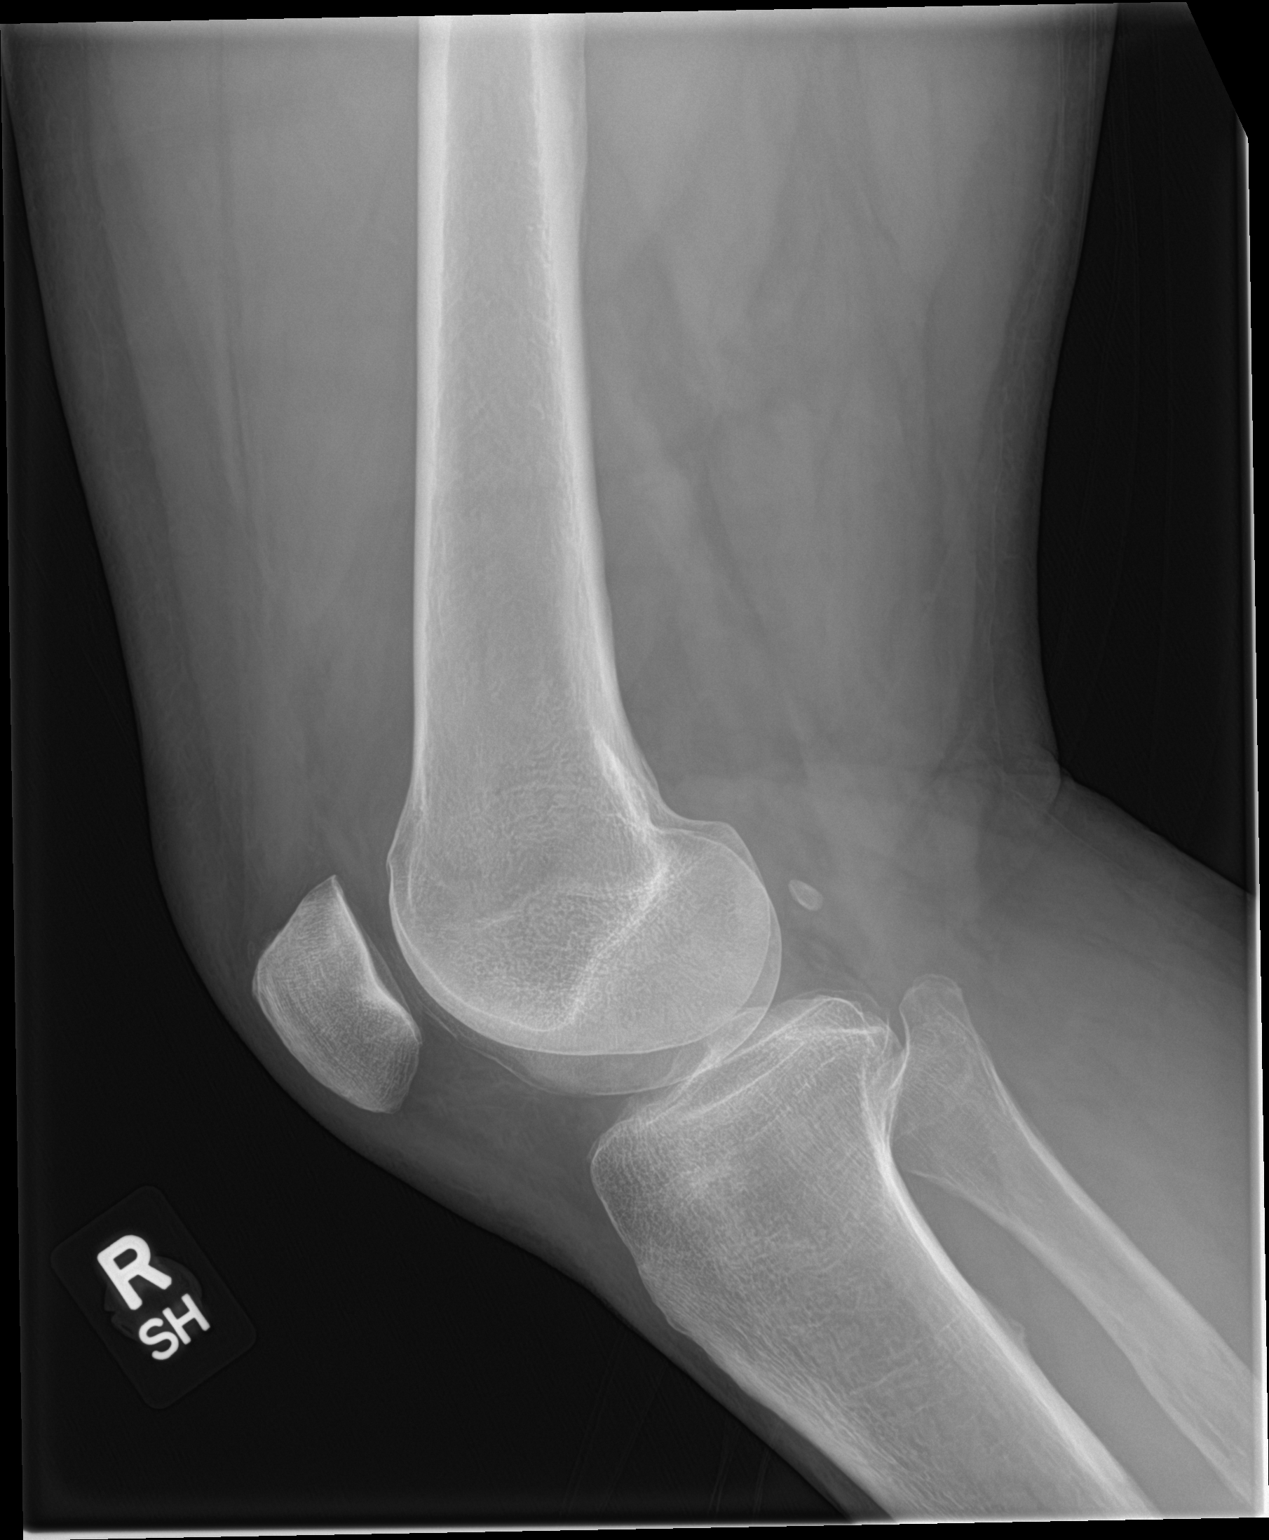

[knee sunrise]
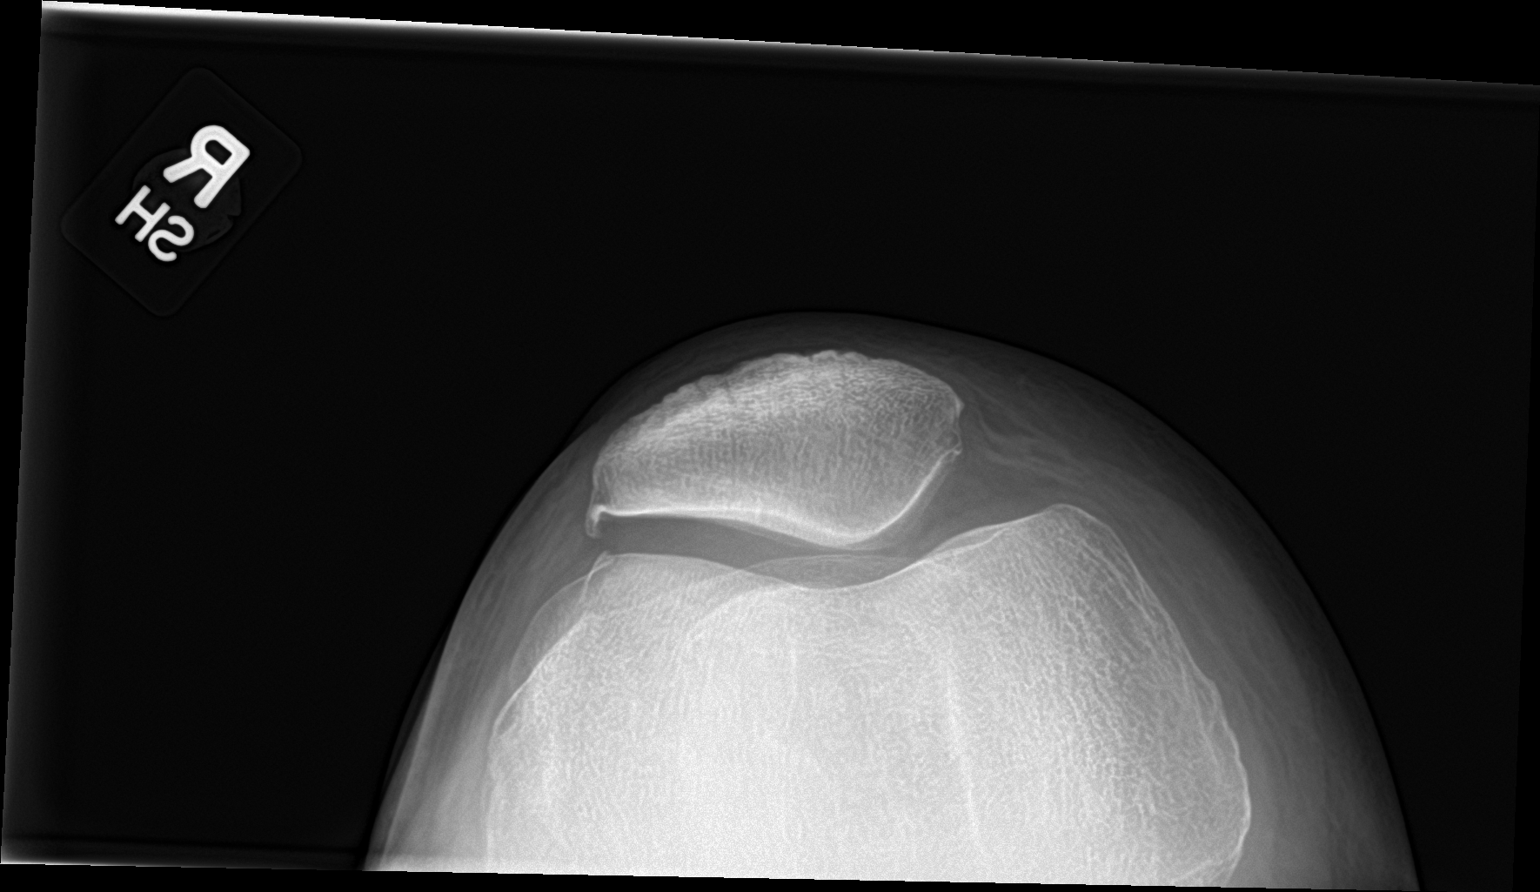

[3 of 3 positions shown; findings below may reference images not displayed]

FINDINGS: No evidence of fracture, dislocation, or joint effusion. Joint
spaces are relatively well maintained. Small lateral patellar facet
marginal osteophyte. Soft tissues are unremarkable.
IMPRESSION: No acute osseous injury of the right femur.

## 2023-08-21 DIAGNOSIS — G5793 Unspecified mononeuropathy of bilateral lower limbs: Secondary | ICD-10-CM | POA: Diagnosis not present

## 2023-08-21 DIAGNOSIS — R350 Frequency of micturition: Secondary | ICD-10-CM | POA: Diagnosis not present

## 2023-08-21 DIAGNOSIS — E785 Hyperlipidemia, unspecified: Secondary | ICD-10-CM | POA: Diagnosis not present

## 2023-08-21 DIAGNOSIS — I10 Essential (primary) hypertension: Secondary | ICD-10-CM | POA: Diagnosis not present

## 2023-08-21 DIAGNOSIS — G20C Parkinsonism, unspecified: Secondary | ICD-10-CM | POA: Diagnosis not present
